# Patient Record
Sex: Female | Born: 1970 | Race: White | Hispanic: No | Marital: Single | Smoking: Never smoker
Health system: Southern US, Community
[De-identification: ages and names within clinical notes are randomized; demographics above are authoritative.]

## PROBLEM LIST (undated history)

## (undated) HISTORY — PX: CHOLECYSTECTOMY: SHX55

---

## 2002-09-01 ENCOUNTER — Encounter: Payer: Self-pay | Admitting: Family Medicine

## 2002-09-01 ENCOUNTER — Encounter: Admission: RE | Admit: 2002-09-01 | Discharge: 2002-09-01 | Payer: Self-pay | Admitting: Family Medicine

## 2002-10-20 ENCOUNTER — Encounter: Admission: RE | Admit: 2002-10-20 | Discharge: 2002-10-20 | Payer: Self-pay | Admitting: Family Medicine

## 2002-10-20 ENCOUNTER — Encounter: Payer: Self-pay | Admitting: Family Medicine

## 2011-02-15 ENCOUNTER — Emergency Department (INDEPENDENT_AMBULATORY_CARE_PROVIDER_SITE_OTHER): Payer: BC Managed Care – PPO

## 2011-02-15 ENCOUNTER — Emergency Department (HOSPITAL_BASED_OUTPATIENT_CLINIC_OR_DEPARTMENT_OTHER)
Admission: EM | Admit: 2011-02-15 | Discharge: 2011-02-15 | Disposition: A | Discharge status: Home / Self Care | Payer: BC Managed Care – PPO | Attending: Emergency Medicine | Admitting: Emergency Medicine

## 2011-02-15 ENCOUNTER — Encounter: Payer: Self-pay | Admitting: Emergency Medicine

## 2011-02-15 DIAGNOSIS — X58XXXA Exposure to other specified factors, initial encounter: Secondary | ICD-10-CM

## 2011-02-15 DIAGNOSIS — S92912A Unspecified fracture of left toe(s), initial encounter for closed fracture: Secondary | ICD-10-CM

## 2011-02-15 DIAGNOSIS — W2209XA Striking against other stationary object, initial encounter: Secondary | ICD-10-CM | POA: Insufficient documentation

## 2011-02-15 DIAGNOSIS — S92919A Unspecified fracture of unspecified toe(s), initial encounter for closed fracture: Secondary | ICD-10-CM | POA: Insufficient documentation

## 2011-02-15 DIAGNOSIS — M25579 Pain in unspecified ankle and joints of unspecified foot: Secondary | ICD-10-CM

## 2011-02-15 MED ORDER — HYDROCODONE-ACETAMINOPHEN 5-325 MG PO TABS
1.0000 | ORAL_TABLET | Freq: Once | ORAL | Status: AC
  Administered 2011-02-15: 1 via ORAL
  Filled 2011-02-15: qty 1

## 2011-02-15 MED ORDER — HYDROCODONE-ACETAMINOPHEN 5-325 MG PO TABS: 1.0000 | ORAL_TABLET | Freq: Once | ORAL | Status: DC

## 2011-02-15 MED ORDER — HYDROCODONE-ACETAMINOPHEN 5-325 MG PO TABS: 1.0000 | ORAL_TABLET | ORAL | Status: AC | PRN

## 2011-02-15 NOTE — ED Provider Notes (Signed)
History     Chief Complaint  Patient presents with  . Foot Injury   HPI Comments: Pt stubbed her left little toe on a step around 3 or 4 PM today.  Her 4th and 5th toes have become swollen and painful.    Patient is a 40 y.o. female presenting with foot injury.  Foot Injury  The incident occurred 6 to 12 hours ago. The incident occurred at home. The injury mechanism was a direct blow. The pain is present in the left toes. The quality of the pain is described as aching. The pain is moderate. The pain has been constant since onset. The symptoms are aggravated by bearing weight. She has tried nothing for the symptoms.    Past Medical History  Diagnosis Date  . Migraine     Past Surgical History  Procedure Date  . Cholecystectomy     History reviewed. No pertinent family history.  History  Substance Use Topics  . Smoking status: Never Smoker   . Smokeless tobacco: Not on file  . Alcohol Use: Yes     occ    OB History    Grav Para Term Preterm Abortions TAB SAB Ect Mult Living                  Review of Systems  Constitutional: Negative.   HENT: Negative.   Eyes: Negative.   Respiratory: Negative.   Cardiovascular: Negative.   Gastrointestinal: Negative.   Genitourinary: Negative.   Musculoskeletal:       Toe injury  Skin: Negative.   Neurological: Negative.   Psychiatric/Behavioral: Negative.     Physical Exam  BP 113/69  Pulse 88  Temp(Src) 98.9 F (37.2 C) (Oral)  Resp 16  SpO2 99%  LMP 02/08/2011  Physical Exam  Constitutional: She is oriented to person, place, and time. She appears well-developed and well-nourished. No distress.  HENT:  Head: Normocephalic and atraumatic.  Musculoskeletal:       Left 4th and 5th toes are swollen and ecchymotic and tender to palpation.  There is no palpable bony deformity.  Skin intact.  Intact sensation and tendon function in the left foot.    Neurological: She is alert and oriented to person, place, and time.    Skin: Skin is warm and dry.  Psychiatric: She has a normal mood and affect. Her behavior is normal.    ED Course  Procedures  MDM  X-rays show undisplaced fracture of the proximal phalanx of the left 5th toe.  Rx with buddy taping and postop show, Hydrocodone-acetaminophen for pain.      Carleene Cooper III, MD 02/16/11 2051

## 2014-09-30 ENCOUNTER — Emergency Department
Admission: EM | Admit: 2014-09-30 | Discharge: 2014-09-30 | Disposition: A | Discharge status: Home / Self Care | Payer: 59 | Source: Home / Self Care | Attending: Emergency Medicine | Admitting: Emergency Medicine

## 2014-09-30 ENCOUNTER — Encounter: Payer: Self-pay | Admitting: Emergency Medicine

## 2014-09-30 DIAGNOSIS — J028 Acute pharyngitis due to other specified organisms: Secondary | ICD-10-CM

## 2014-09-30 LAB — POCT INFLUENZA A/B
INFLUENZA A, POC: NEGATIVE
INFLUENZA B, POC: NEGATIVE

## 2014-09-30 LAB — POCT RAPID STREP A (OFFICE): Rapid Strep A Screen: NEGATIVE

## 2014-09-30 MED ORDER — AMOXICILLIN 500 MG PO CAPS: 500.0000 mg | ORAL_CAPSULE | Freq: Three times a day (TID) | ORAL | Status: DC

## 2014-09-30 NOTE — Discharge Instructions (Signed)

## 2014-09-30 NOTE — ED Notes (Signed)
Reports onset of fever (102.3) last night with severe body aches and sore throat, extreme fatigue. Did not have Flu vaccination this season and is worried about transmitting illness to her special needs son. Took tylenol at 1000 today.

## 2014-09-30 NOTE — ED Provider Notes (Signed)
CSN: [id number]     Arrival date & time 09/30/14  1203 History   First MD Initiated Contact with Patient 09/30/14 1304     Chief Complaint  Patient presents with  . Fever  . Generalized Body Aches  . Sore Throat   (Consider location/radiation/quality/duration/timing/severity/associated sxs/prior Treatment) Patient is a 44 y.o. female presenting with fever and pharyngitis. The history is provided by the patient. No language interpreter was used.  Fever Max temp prior to arrival:  100.2 Temp source:  Oral Severity:  Moderate Onset quality:  Gradual Timing:  Constant Progression:  Worsening Chronicity:  New Relieved by:  Nothing Ineffective treatments:  None tried Associated symptoms: congestion, myalgias and sore throat   Risk factors: no immunosuppression   Sore Throat    Past Medical History  Diagnosis Date  . Migraine    Past Surgical History  Procedure Laterality Date  . Cholecystectomy     History reviewed. No pertinent family history. History  Substance Use Topics  . Smoking status: Never Smoker   . Smokeless tobacco: Not on file  . Alcohol Use: Yes     Comment: occ   OB History    No data available     Review of Systems  Constitutional: Positive for fever.  HENT: Positive for congestion and sore throat.   Musculoskeletal: Positive for myalgias.  All other systems reviewed and are negative.   Allergies  Review of patient's allergies indicates no known allergies.  Home Medications   Prior to Admission medications   Medication Sig Start Date End Date Taking? Authorizing Provider  amoxicillin (AMOXIL) 500 MG capsule Take 1 capsule (500 mg total) by mouth 3 (three) times daily. 09/30/14   Elson AreasLeslie K Zailynn Brandel, PA-C  Multiple Vitamin (MULTIVITAMIN PO) Take 1 tablet by mouth daily.      Historical Provider, MD  rizatriptan (MAXALT-MLT) 10 MG disintegrating tablet Take 10 mg by mouth as needed. May repeat in 2 hours if needed     Historical Provider, MD   topiramate (TOPAMAX) 25 MG tablet Take 50 mg by mouth daily.      Historical Provider, MD   BP 105/71 mmHg  Pulse 102  Temp(Src) 98.5 F (36.9 C) (Oral)  Resp 16  Ht 5\' 2"  (1.575 m)  Wt 138 lb (62.596 kg)  BMI 25.23 kg/m2  SpO2 97%  LMP 09/13/2014 Physical Exam  Constitutional: She is oriented to person, place, and time. She appears well-developed and well-nourished.  HENT:  Head: Normocephalic.  Right Ear: External ear normal.  Left Ear: External ear normal.  Erythema throat  Eyes: EOM are normal. Pupils are equal, round, and reactive to light.  Neck: Normal range of motion.  Pulmonary/Chest: Effort normal.  Abdominal: She exhibits no distension.  Musculoskeletal: Normal range of motion.  Neurological: She is alert and oriented to person, place, and time.  Skin: Skin is warm.  Psychiatric: She has a normal mood and affect.  Nursing note and vitals reviewed. strep negative Influenza negative  ED Course  Procedures (including critical care time) Labs Review Labs Reviewed  POCT RAPID STREP A (OFFICE)  POCT INFLUENZA A/B    Imaging Review No results found.   MDM   1. Acute pharyngitis due to other specified organisms    Amoxicillian AVs    Elson AreasLeslie K Nika Yazzie, PA-C 09/30/14 [id number]  Annaleise Burger K BoswellSofia, [place] JerseyPA-C 09/30/14 1413

## 2016-06-30 ENCOUNTER — Emergency Department (INDEPENDENT_AMBULATORY_CARE_PROVIDER_SITE_OTHER)
Admission: EM | Admit: 2016-06-30 | Discharge: 2016-06-30 | Disposition: A | Discharge status: Home / Self Care | Payer: BLUE CROSS/BLUE SHIELD | Source: Home / Self Care | Attending: Family Medicine | Admitting: Family Medicine

## 2016-06-30 ENCOUNTER — Encounter: Payer: Self-pay | Admitting: *Deleted

## 2016-06-30 ENCOUNTER — Emergency Department: Payer: BLUE CROSS/BLUE SHIELD

## 2016-06-30 DIAGNOSIS — S8012XA Contusion of left lower leg, initial encounter: Secondary | ICD-10-CM | POA: Diagnosis not present

## 2016-06-30 NOTE — ED Triage Notes (Signed)
Patient c/o large hematoma that form with swelling to left upper thigh 3 days ago without injury. Swelling has resolved but bruise stile present. C/o lower leg aching yesterday. Applying heat yesterday.

## 2016-06-30 NOTE — Discharge Instructions (Signed)
Apply heating pad 2 or 3 times daily for about 30 minutes.

## 2016-06-30 NOTE — ED Provider Notes (Signed)
[facility]    CSN: [id number] Arrival date & time: 06/30/16  0806     History   Chief Complaint Chief Complaint  Patient presents with  . Hematoma    HPI Allison Weber is a 45 y.o. female.   Three days ago at about 8:30pm patient felt a pinching sensation in her left medial thigh.  She recalls no trauma to the area.  She noticed distinct swelling to her left medial thigh and subsequently developed a large bruise as the swelling subsided.  She denies chest pain or shortness of breath.  She applied a heating pad yesterday.   The history is provided by the patient.  Leg Pain  Location:  Leg Time since incident:  3 days Injury: no   Leg location:  L upper leg Pain details:    Quality:  Aching   Radiates to:  Does not radiate   Severity:  Mild   Onset quality:  Sudden   Duration:  3 days   Timing:  Constant   Progression:  Improving Chronicity:  New Prior injury to area:  No Relieved by:  Nothing Worsened by:  Nothing Ineffective treatments:  None tried Associated symptoms: swelling   Associated symptoms: no decreased ROM, no fatigue, no fever, no muscle weakness, no numbness, no stiffness and no tingling     Past Medical History:  Diagnosis Date  . Migraine     There are no active problems to display for this patient.   Past Surgical History:  Procedure Laterality Date  . CHOLECYSTECTOMY      OB History    No data available       Home Medications    Prior to Admission medications   Medication Sig Start Date End Date Taking? Authorizing Provider  Multiple Vitamin (MULTIVITAMIN PO) Take 1 tablet by mouth daily.      Historical Provider, MD  rizatriptan (MAXALT-MLT) 10 MG disintegrating tablet Take 10 mg by mouth as needed. May repeat in 2 hours if needed     Historical Provider, MD  topiramate (TOPAMAX) 25 MG tablet Take 50 mg by mouth daily.      Historical Provider, MD    Family History Family History  Problem Relation Age of  Onset  . Lupus Mother   . Brain cancer Father     Social History Social History  Substance Use Topics  . Smoking status: Never Smoker  . Smokeless tobacco: Never Used  . Alcohol use Yes     Comment: occ     Allergies   Patient has no known allergies.   Review of Systems Review of Systems  Constitutional: Negative for fatigue and fever.  Respiratory: Negative for cough, chest tightness, shortness of breath, wheezing and stridor.   Cardiovascular: Negative for chest pain and palpitations.  Musculoskeletal: Negative for stiffness.  All other systems reviewed and are negative.    Physical Exam Triage Vital Signs ED Triage Vitals  Enc Vitals Group     BP 06/30/16 0850 128/81     Pulse Rate 06/30/16 0850 78     Resp 06/30/16 0850 16     Temp 06/30/16 0850 98 F (36.7 C)     Temp Source 06/30/16 0850 Oral     SpO2 06/30/16 0850 98 %     Weight 06/30/16 0851 149 lb (67.6 kg)     Height --      Head Circumference --      Peak Flow --  Pain Score 06/30/16 0852 1     Pain Loc --      Pain Edu? --      Excl. in GC? --    No data found.   Updated Vital Signs BP 128/81 (BP Location: Left Arm)   Pulse 78   Temp 98 F (36.7 C) (Oral)   Resp 16   Wt 149 lb (67.6 kg)   LMP 06/21/2016   SpO2 98%   BMI 27.25 kg/m   Visual Acuity Right Eye Distance:   Left Eye Distance:   Bilateral Distance:    Right Eye Near:   Left Eye Near:    Bilateral Near:     Physical Exam  Constitutional: She appears well-developed and well-nourished. No distress.  HENT:  Head: Normocephalic.  Right Ear: External ear normal.  Left Ear: External ear normal.  Nose: Nose normal.  Mouth/Throat: Oropharynx is clear and moist.  Eyes: Conjunctivae are normal. Pupils are equal, round, and reactive to light.  Neck: Normal range of motion.  Pulmonary/Chest: Breath sounds normal. She has no wheezes. She has no rales.  Abdominal: There is no tenderness.  Musculoskeletal:       Left  upper leg: She exhibits tenderness. She exhibits no swelling and no edema.       Legs: Left medial thigh has a 7cm diameter area of ecchymosis that is mildly tender to palpation.  There is mild tenderness extending to the posterior thigh.  Neurological: She is alert.  Skin: Skin is warm and dry.  Nursing note and vitals reviewed.    UC Treatments / Results  Labs (all labs ordered are listed, but only abnormal results are displayed) Labs Reviewed - No data to display  EKG  EKG Interpretation None       Radiology [place] Venous Img Lower Unilateral Left  Result Date: 06/30/2016 CLINICAL DATA:  Three day history of explained left thigh hematoma. EXAM: LEFT LOWER EXTREMITY VENOUS DOPPLER ULTRASOUND TECHNIQUE: Gray-scale sonography with graded compression, as well as color Doppler and duplex ultrasound, were performed to evaluate the deep venous system from the level of the common femoral vein through the popliteal and proximal calf veins. Spectral Doppler was utilized to evaluate flow at rest and with distal augmentation maneuvers. COMPARISON:  None. FINDINGS: Right common femoral vein is patent without thrombus. Normal compressibility, augmentation and color Doppler flow in the left common femoral vein, left femoral vein and left popliteal vein. The left saphenofemoral junction is patent. Left profunda femoral vein is patent without thrombus. Visualized left deep calf veins are patent without thrombus. Visualized left great saphenous vein is compressible without thrombus. There is a small hypoechoic area at the area of concern in the medial thigh. This structure measures 1.9 x 0.6 x 1.0 cm. Structure is heterogeneous and there is no significant internal blood flow. IMPRESSION: Negative for deep venous thrombosis in left lower extremity. Small complex fluid collection in the superficial left thigh. This may represent a hematoma. Electronically Signed   By: Richarda OverlieAdam  Henn M.D.   On: 06/30/2016 10:42     Procedures Procedures (including critical care time)  Medications Ordered in UC Medications - No data to display   Initial Impression / Assessment and Plan / UC Course  I have reviewed the triage vital signs and the nursing notes.  Pertinent labs & imaging results that were available during my care of the patient were reviewed by me and considered in my medical decision making (see chart for details).  Clinical  Course   No evidence DVT. Apply heating pad 2 or 3 times daily for about 30 minutes. Followup with Family Doctor if not improved in about 2 weeks.    Final Clinical Impressions(s) / UC Diagnoses   Final diagnoses:  Hematoma of leg, left, initial encounter    New Prescriptions New Prescriptions   No medications on file     Lattie HawStephen A Beese, MD 07/03/16 [phone]

## 2018-11-03 IMAGING — US US EXTREM LOW VENOUS*L*
1 series · 13 of 24 positions shown · non-contrast
Comparison: None.

CLINICAL DATA: Three day history of explained left thigh hematoma.

EXAM:
LEFT LOWER EXTREMITY VENOUS DOPPLER ULTRASOUND
TECHNIQUE: Gray-scale sonography with graded compression, as well as color
Doppler and duplex ultrasound, were performed to evaluate the deep
venous system from the level of the common femoral vein through the
popliteal and proximal calf veins. Spectral Doppler was utilized to
evaluate flow at rest and with distal augmentation maneuvers.

[Series 1: us extrem low venous*left* · 0.07mm/px · 13 of 36 slices shown]
[im 1/36]
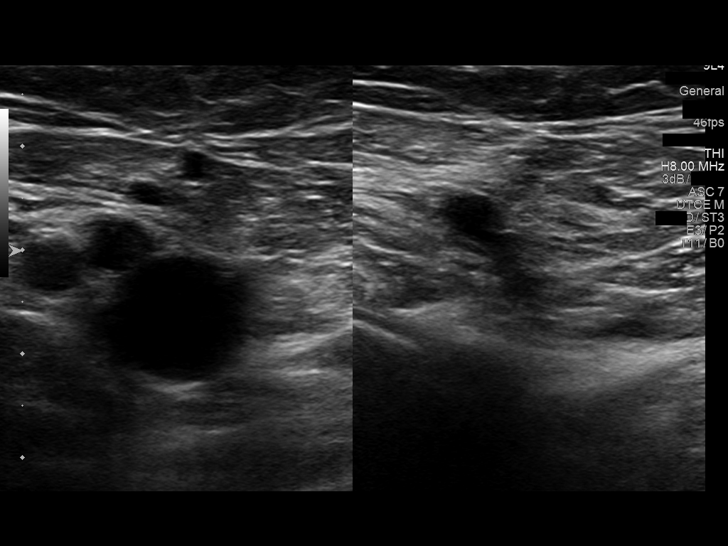
[im 4/36]
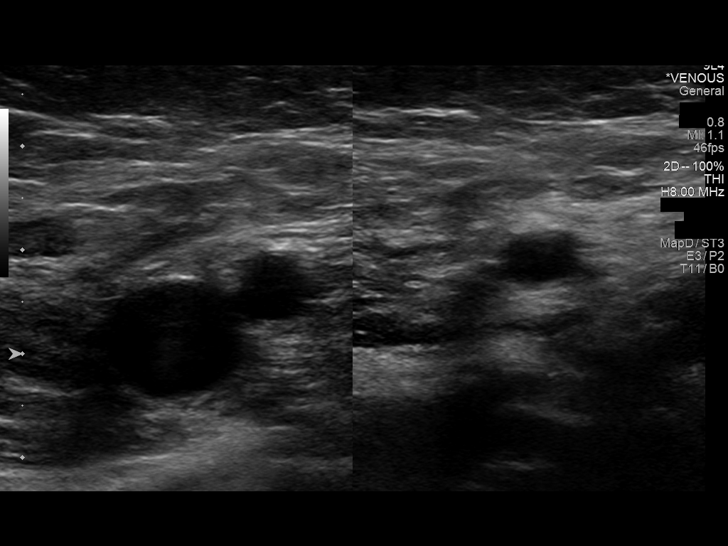
[im 7/36]
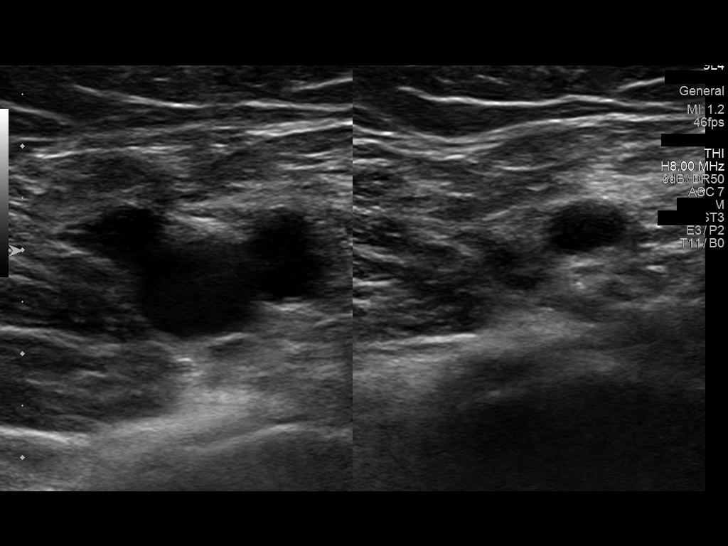
[im 10/36]
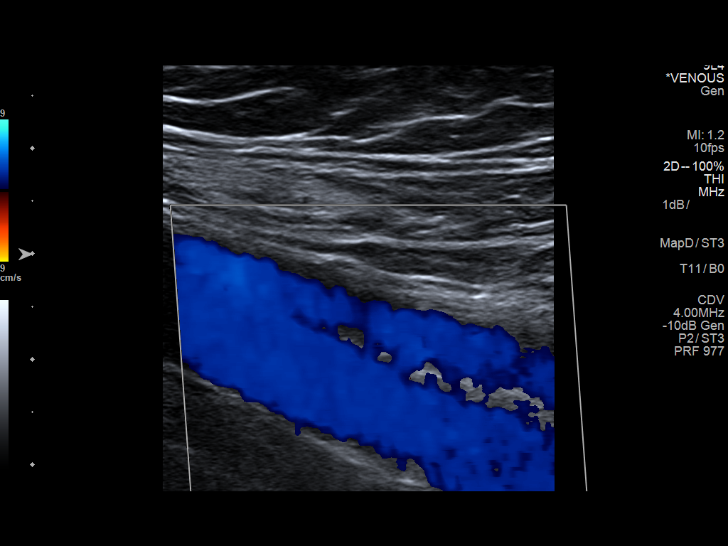
[im 13/36]
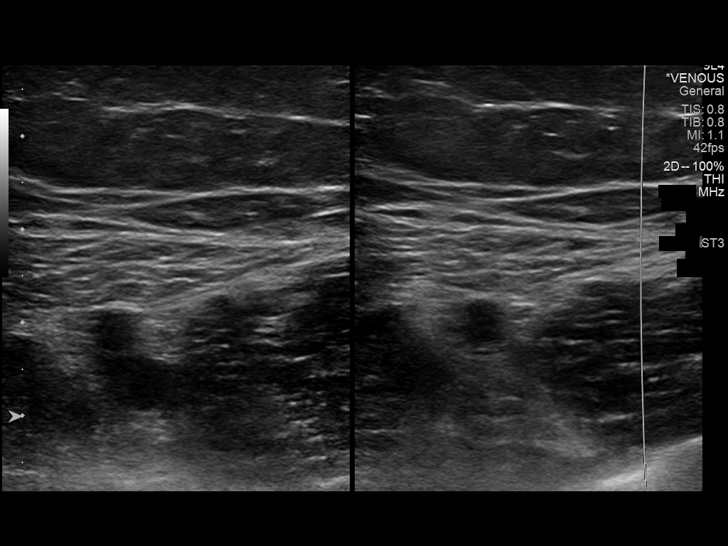
[im 16/36]
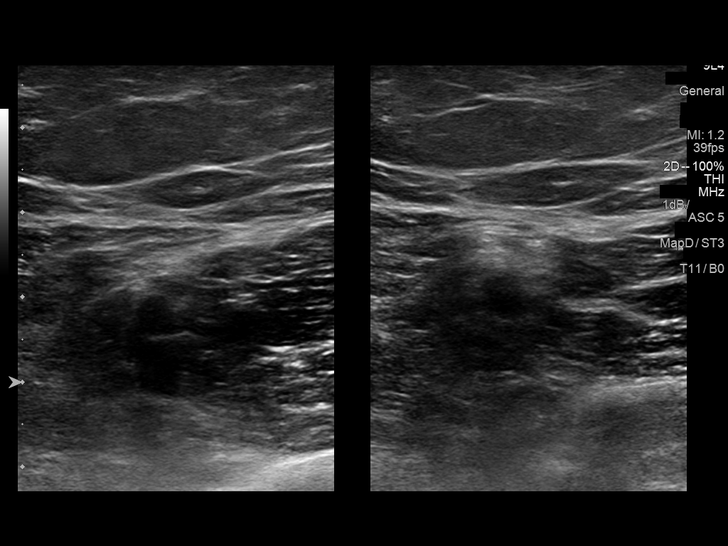
[im 19/36]
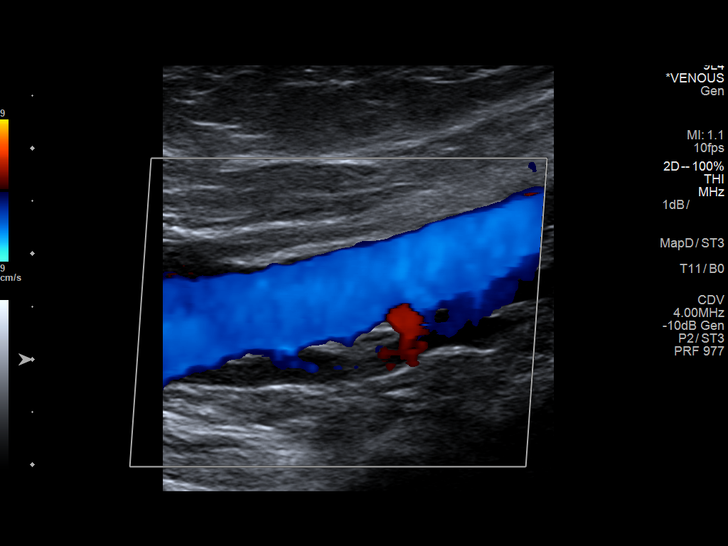
[im 20/36]
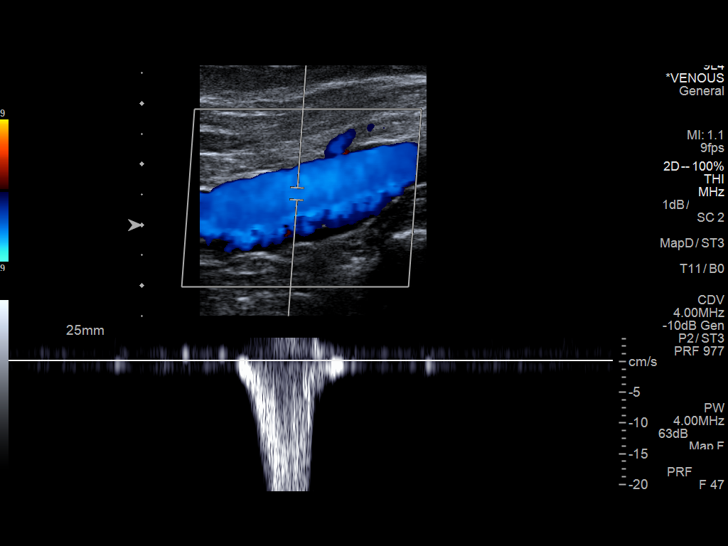
[im 23/36]
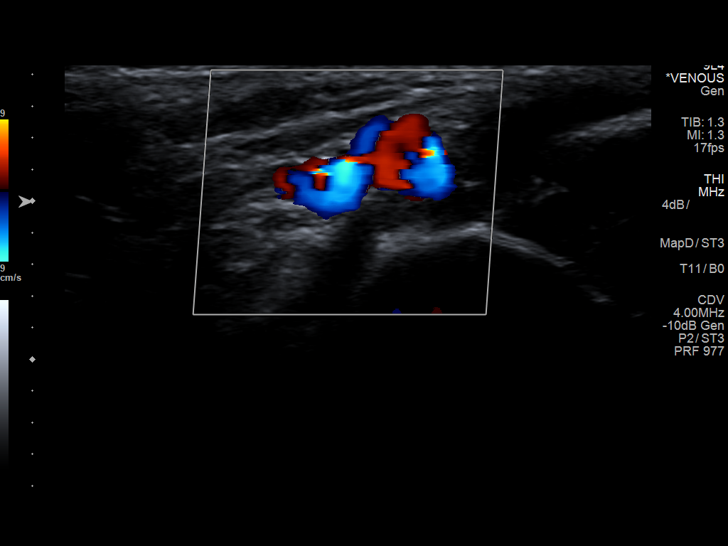
[im 26/36]
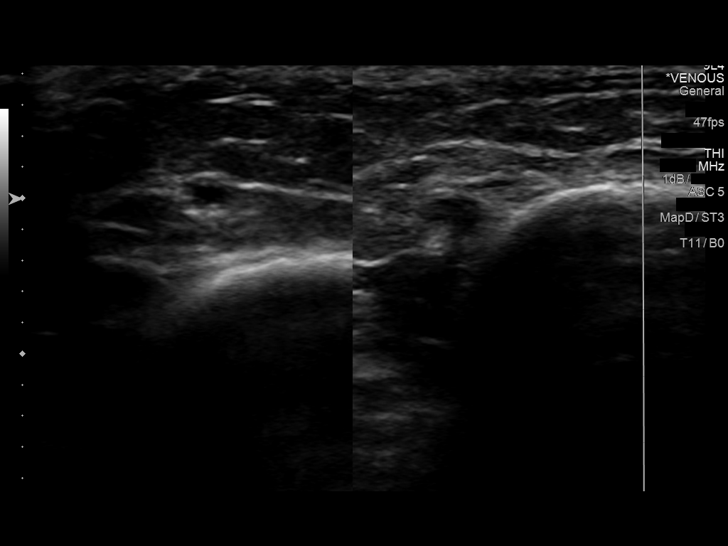
[im 29/36]
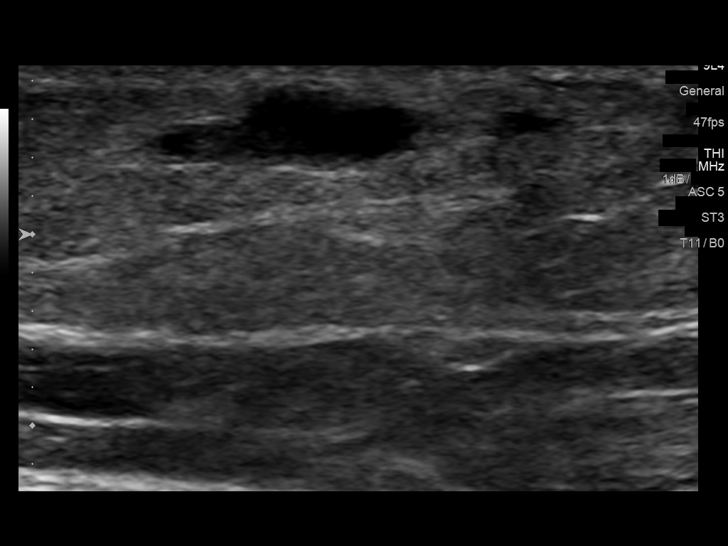
[im 32/36]
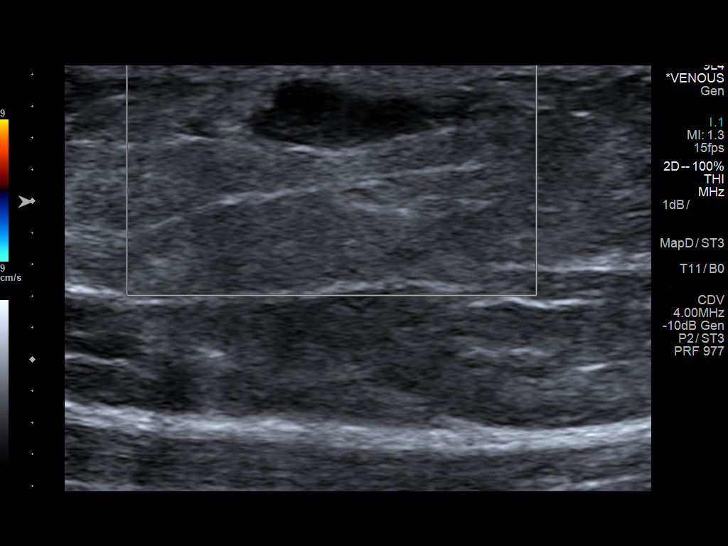
[im 36/36]
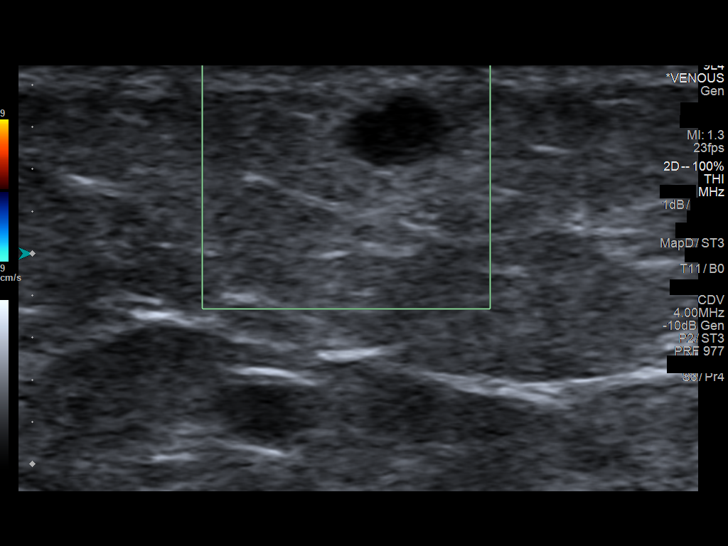

[13 of 24 positions shown; findings below may reference images not displayed]

FINDINGS: Right common femoral vein is patent without thrombus.

Normal compressibility, augmentation and color Doppler flow in the
left common femoral vein, left femoral vein and left popliteal vein.
The left saphenofemoral junction is patent. Left profunda femoral
vein is patent without thrombus. Visualized left deep calf veins are
patent without thrombus. Visualized left great saphenous vein is
compressible without thrombus.

There is a small hypoechoic area at the area of concern in the
medial thigh. This structure measures 1.9 x 0.6 x 1.0 cm. Structure
is heterogeneous and there is no significant internal blood flow.
IMPRESSION: Negative for deep venous thrombosis in left lower extremity.

Small complex fluid collection in the superficial left thigh. This
may represent a hematoma.

## 2023-02-25 ENCOUNTER — Ambulatory Visit
Admission: EM | Admit: 2023-02-25 | Discharge: 2023-02-25 | Disposition: A | Discharge status: Home / Self Care | Payer: BC Managed Care – PPO | Attending: Family Medicine | Admitting: Family Medicine

## 2023-02-25 DIAGNOSIS — M5412 Radiculopathy, cervical region: Secondary | ICD-10-CM

## 2023-02-25 MED ORDER — GABAPENTIN 100 MG PO CAPS: ORAL_CAPSULE | ORAL | 0 refills | Status: AC

## 2023-02-25 MED ORDER — PREDNISONE 20 MG PO TABS: 40.0000 mg | ORAL_TABLET | Freq: Every day | ORAL | 0 refills | Status: AC

## 2023-02-25 MED ORDER — TRAMADOL HCL 50 MG PO TABS: 50.0000 mg | ORAL_TABLET | Freq: Four times a day (QID) | ORAL | 0 refills | Status: AC | PRN

## 2023-02-25 MED ORDER — KETOROLAC TROMETHAMINE 30 MG/ML IJ SOLN
30.0000 mg | Freq: Once | INTRAMUSCULAR | Status: AC
  Administered 2023-02-25: 30 mg via INTRAMUSCULAR

## 2023-02-25 NOTE — Discharge Instructions (Signed)
You have been given a shot of Toradol 30 mg today.  Take prednisone 20 mg--2 daily for 5 days; this is for swelling/inflammation that may be occurring around a nerve  Take tramadol 50 mg-- 1 tablet every 6 hours as needed for pain.  This medication can make you sleepy or dizzy  Gabapentin is for nerve pain. Start with 1 every bedtime x 2 days, then 2 every evening. After 2 more days, start taking 1 in the AM and 2 in the evening. It can cause sleepiness, but often starting slow on the dose, you can get used to it.

## 2023-02-25 NOTE — ED Provider Notes (Signed)
[facility]    CSN: [id number] Arrival date & time: 02/25/23  1743      History   Chief Complaint Chief Complaint  Patient presents with   Neck Pain   Arm Pain    HPI Allison Weber is a 52 y.o. female.    Neck Pain Arm Pain  Here for right shoulder pain/neck pain, radiating into her right arm. It began on 7/15, and has progressed/worsened each day. No trauma or fall, but does do some lifting while helping her special needs son at home.  No f/c/URI symptoms/rash.   The pain is in the posterior right shoulder, and the radiating pain is worst around her elbow, an intense burning pain. Sometimes her hand hurts. There is some tingling some in her right hand.  NKDA  LMP in January, on chronic progesterone  Past Medical History:  Diagnosis Date   Migraine     There are no problems to display for this patient.   Past Surgical History:  Procedure Laterality Date   CHOLECYSTECTOMY      OB History   No obstetric history on file.      Home Medications    Prior to Admission medications   Medication Sig Start Date End Date Taking? Authorizing Provider  gabapentin (NEURONTIN) 100 MG capsule Take 1 tablet at bedtime x 2 nights, then start taking 1 tablet 2 times daily. Then after 2 days, increase to 1 tablet in the AM and 2 at bedtime. 02/25/23  Yes Zenia Resides, MD  predniSONE (DELTASONE) 20 MG tablet Take 2 tablets (40 mg total) by mouth daily with breakfast for 5 days. 02/25/23 03/02/23 Yes Alastor Kneale, Janace Aris, MD  traMADol (ULTRAM) 50 MG tablet Take 1 tablet (50 mg total) by mouth every 6 (six) hours as needed (pain). 02/25/23  Yes Zenia Resides, MD  Multiple Vitamin (MULTIVITAMIN PO) Take 1 tablet by mouth daily.      [provider]  rizatriptan (MAXALT-MLT) 10 MG disintegrating tablet Take 10 mg by mouth as needed. May repeat in 2 hours if needed     [provider]  topiramate (TOPAMAX) 25 MG tablet Take 50 mg by mouth  daily.      [provider]    Family History Family History  Problem Relation Age of Onset   Lupus Mother    Brain cancer Father     Social History Social History   Tobacco Use   Smoking status: Never   Smokeless tobacco: Never  Substance Use Topics   Alcohol use: Yes    Comment: occ   Drug use: No     Allergies   Patient has no known allergies.   Review of Systems Review of Systems  Musculoskeletal:  Positive for neck pain.     Physical Exam Triage Vital Signs ED Triage Vitals  Encounter Vitals Group     BP 02/25/23 1805 (!) 161/85     Systolic BP Percentile --      Diastolic BP Percentile --      Pulse Rate 02/25/23 1805 80     Resp 02/25/23 1805 16     Temp 02/25/23 1805 98.2 F (36.8 C)     Temp src --      SpO2 02/25/23 1805 98 %     Weight --      Height --      Head Circumference --      Peak Flow --      Pain  Score 02/25/23 1804 10     Pain Loc --      Pain Education --      Exclude from Growth Chart --    No data found.  Updated Vital Signs BP (!) 161/85   Pulse 80   Temp 98.2 F (36.8 C)   Resp 16   LMP  (LMP Unknown) Comment: on pogesterone  SpO2 98%   Visual Acuity Right Eye Distance:   Left Eye Distance:   Bilateral Distance:    Right Eye Near:   Left Eye Near:    Bilateral Near:     Physical Exam Vitals reviewed.  Constitutional:      General: She is not in acute distress.    Appearance: She is not ill-appearing, toxic-appearing or diaphoretic.  Cardiovascular:     Rate and Rhythm: Normal rate and regular rhythm.  Pulmonary:     Effort: Pulmonary effort is normal.     Breath sounds: Normal breath sounds.  Musculoskeletal:     Comments: She is tender over her posterior right shoulder and some onto her right upper arm. No deformity or edema. No rash or erythema  Skin:    Coloration: Skin is not pale.     Findings: No rash.  Neurological:     General: No focal deficit present.     Mental Status: She is  alert and oriented to person, place, and time.  Psychiatric:        Behavior: Behavior normal.      UC Treatments / Results  Labs (all labs ordered are listed, but only abnormal results are displayed) Labs Reviewed - No data to display  EKG   Radiology No results found.  Procedures Procedures (including critical care time)  Medications Ordered in UC Medications  ketorolac (TORADOL) 30 MG/ML injection 30 mg (has no administration in time range)    Initial Impression / Assessment and Plan / UC Course  I have reviewed the triage vital signs and the nursing notes.  Pertinent labs & imaging results that were available during my care of the patient were reviewed by me and considered in my medical decision making (see chart for details).        Injection of toradol is given here. 5 day burst of prednisone is sent in, and tramadol for pain relief (I don't want to rx NSAIDS to be taken with the prednisone). Gabapentin is sent in for neuropathic pain.  She will f/u with her gyn, whom she sees for primary care issues too. Final Clinical Impressions(s) / UC Diagnoses   Final diagnoses:  Cervical radiculopathy     Discharge Instructions      You have been given a shot of Toradol 30 mg today.  Take prednisone 20 mg--2 daily for 5 days; this is for swelling/inflammation that may be occurring around a nerve  Take tramadol 50 mg-- 1 tablet every 6 hours as needed for pain.  This medication can make you sleepy or dizzy  Gabapentin is for nerve pain. Start with 1 every bedtime x 2 days, then 2 every evening. After 2 more days, start taking 1 in the AM and 2 in the evening. It can cause sleepiness, but often starting slow on the dose, you can get used to it.      ED Prescriptions     Medication Sig Dispense Auth. Provider   predniSONE (DELTASONE) 20 MG tablet Take 2 tablets (40 mg total) by mouth daily with breakfast for 5 days. 10 tablet  Zenia Resides, MD   traMADol  (ULTRAM) 50 MG tablet Take 1 tablet (50 mg total) by mouth every 6 (six) hours as needed (pain). 12 tablet Zenia Resides, MD   gabapentin (NEURONTIN) 100 MG capsule Take 1 tablet at bedtime x 2 nights, then start taking 1 tablet 2 times daily. Then after 2 days, increase to 1 tablet in the AM and 2 at bedtime. 90 capsule Allison Weber, Janace Aris, MD      I have reviewed the PDMP during this encounter.   Zenia Resides, MD 02/25/23 [phone]

## 2023-02-25 NOTE — ED Triage Notes (Signed)
Pt presents to [facility] with co of right arm, neck and shoulder pain that started on Sunday. Pt reports it feels like a hot poker stuck in her elbow. Pt did she a physical therapist with no Improvment
